# Patient Record
Sex: Female | Born: 1945
Health system: Southern US, Community
[De-identification: ages and names within clinical notes are randomized; demographics above are authoritative.]

---

## 2012-10-01 ENCOUNTER — Ambulatory Visit: Payer: Self-pay | Admitting: Family Medicine

## 2012-10-06 ENCOUNTER — Ambulatory Visit: Payer: Self-pay

## 2012-10-18 ENCOUNTER — Ambulatory Visit: Payer: Self-pay | Admitting: Gynecologic Oncology

## 2012-10-19 ENCOUNTER — Ambulatory Visit: Payer: Self-pay | Admitting: Gynecologic Oncology

## 2012-11-02 ENCOUNTER — Ambulatory Visit: Payer: Self-pay | Admitting: Gynecologic Oncology

## 2012-11-02 DIAGNOSIS — I10 Essential (primary) hypertension: Secondary | ICD-10-CM

## 2012-11-02 LAB — BASIC METABOLIC PANEL
Anion Gap: 4 — ABNORMAL LOW (ref 7–16)
BUN: 18 mg/dL (ref 7–18)
Calcium, Total: 9.7 mg/dL (ref 8.5–10.1)
Creatinine: 0.76 mg/dL (ref 0.60–1.30)
EGFR (African American): >60
Glucose: 89 mg/dL (ref 65–99)
Osmolality: 273 (ref 275–301)
Potassium: 3.4 mmol/L — ABNORMAL LOW (ref 3.5–5.1)
Sodium: 136 mmol/L (ref 136–145)

## 2012-11-02 LAB — CBC
HCT: 38.9 % (ref 35.0–47.0)
HGB: 13.1 g/dL (ref 12.0–16.0)
MCHC: 33.6 g/dL (ref 32.0–36.0)
MCV: 88 fL (ref 80–100)
Platelet: 389 10*3/uL (ref 150–440)
RBC: 4.43 10*6/uL (ref 3.80–5.20)
RDW: 15.4 % — ABNORMAL HIGH (ref 11.5–14.5)
WBC: 10.9 10*3/uL (ref 3.6–11.0)

## 2012-11-13 ENCOUNTER — Ambulatory Visit: Payer: Self-pay | Admitting: Gynecologic Oncology

## 2012-11-23 ENCOUNTER — Inpatient Hospital Stay: Payer: Self-pay | Admitting: Obstetrics and Gynecology

## 2012-11-24 LAB — PATHOLOGY REPORT

## 2012-11-24 LAB — CREATININE, SERUM
EGFR (African American): >60
EGFR (Non-African Amer.): >60

## 2012-11-24 LAB — HEMOGLOBIN: HGB: 11.6 g/dL — ABNORMAL LOW (ref 12.0–16.0)

## 2014-05-05 NOTE — Op Note (Signed)
PATIENT NAME:  Christina Werner, Christina MR#:  [id number] DATE OF BIRTH:  1945/02/14  DATE OF PROCEDURE:  11/23/2012  PREOPERATIVE DIAGNOSIS:  Large ovarian cyst on the right, also right lower quadrant pain.  POSTOPERATIVE DIAGNOSIS: Large ovarian cyst on the right, also right lower quadrant pain.  PROCEDURE:  Total abdominal hysterectomy with right salpingo-oophorectomy.  ANESTHESIA:  General endotracheal.  SURGEON:  Dr. Hildred Laser.  ASSISTANT:  Dr. Wendy Poet.  ESTIMATED BLOOD LOSS:  100 mL.  OPERATIVE FLUIDS:  1400 mL.  COMPLICATIONS:  There were none.  FINDINGS:  Thick fibrotic adhesions of the colon to the left lateral posterior surface of the uterus and pelvic sidewall, as well as from the bladder to the cervix.  Also, there was a large degenerative right ovarian mass approximately 10 to 11 cm.  There was a normal-appearing right fallopian tube.   There was a small normal-appearing uterus and cervix.  The left ovary and fallopian tube were noted to be surgically absent.  Ureters were visualized bilaterally intraoperatively.  SPECIMENS:  Included the uterus, right tube and ovary and cervix.  URINE OUTPUT:  300 mL of clear urine at the end of the procedure.  PROCEDURE:  The patient was taken to the operating room where she was placed in the dorsal lithotomy position, given general anesthesia, prepped and draped in normal sterile fashion.  An infraumbilical vertical midline incision was made and extended sharply to the rectus fascia.  The fascia was then incised and extended vertically using the curved Mayo scissors.  The muscles of the anterior abdominal wall were then separated in the midline by sharp and blunt dissection.  The peritoneum was grasped between 2 pickups, elevated, entered sharply with the Metzenbaum scissors.  The pelvis was examined with the findings noted above.  Next, pelvic washings were performed.  After this, a Bookwalter retractor was placed into the incision and the  bowel was tucked away with moist laparotomy sponges.  The ovarian mass was located on the right side and was exteriorized from the abdomen.  The base of the mass was clamped with a Kelly and transected and sent for frozen section.  Next, 2 Kelly clamps were placed on the cornua and used for retraction. The round ligaments on both sides were identified, clamped, transected and suture ligated with 0 Vicryl.  There were noted to be extensive fibrotic adhesions of the colon to the posterior cul-de-sac.  These adhesions were then taken down using sharp dissection with the Metzenbaum scissors.   After dissection, the posterior leaf of the broad ligament was incised bilaterally and the ureters were identified on both sides, noted to have good efflux.  Next, the anterior leaf of the broad ligament was incised along the bladder reflection to the midline from both sides.  Fibrotic adhesions of the bladder to the cervical aspect of the uterus were also noted and taken down using sharp dissection.  After this, the bladder was then gently dissected off the lower uterine segment and the cervix was sponge sticked.  The infundibulopelvic ligament on the right side was then doubly clamped, transected and suture ligated with 0 Vicryl.   Hemostasis was visualized.  The uterine arteries were skeletonized bilaterally, clamped with Heaney clamps, transected and suture ligated with 0 Vicryl. Again, hemostasis was assured.  The uterosacral ligaments had been clamped on both sides, transected and suture ligated in a similar fashion. The cervix and uterus were amputated with the Mayo scissors. The vaginal cuff angles were then closed with figure-of-eight  stitches of 0 Vicryl.  The remainder of the vaginal cuff was then closed with figure-of-eight suture of 0 Vicryl.  Hemostasis was assured. The pelvis was then irrigated copiously with warm normal saline.  All laparotomy sponges and instruments were removed from the abdomen.  After report of  benign findings noted on frozen section, the fascia was then closed with running 0 looped PDS and hemostasis was assured.  The subcutaneous fat layer was then closed with 2-0 Vicryl in a running fashion. The skin was closed with staples.  Sponge, lap, needle and instrument counts were correct x 2.  The patient was taken to the PACU awake and in stable condition.     ____________________________ Christina EarthlyAnika S. Valentino Saxonherry, MD asc:dmm D: 11/23/2012 16:05:10 ET T: 11/23/2012 19:03:32 ET JOB#: [id number]  cc: Christina EarthlyAnika S. Valentino Saxonherry, MD, <Dictator> Christina NovemberANIKA S Carolie Mcilrath MD ELECTRONICALLY SIGNED 12/06/2012 14:42

## 2015-05-01 IMAGING — US ABDOMEN ULTRASOUND LIMITED
1 series · 14 of 25 positions shown · non-contrast
Comparison: none

REASON FOR EXAM: abd pain acute lower quadrant
COMMENTS:

[Series 1: abdomen ultrasound limited · 0.26mm/px · 14 of 68 slices shown]
[im 1/68]
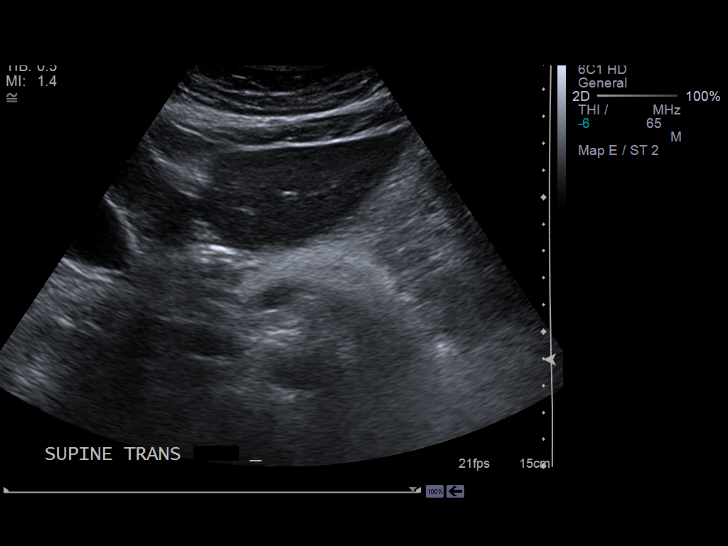
[im 6/68]
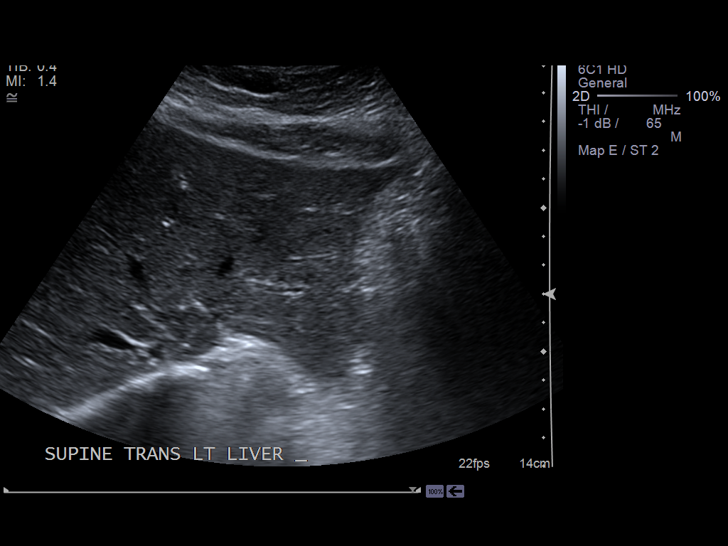
[im 12/68]
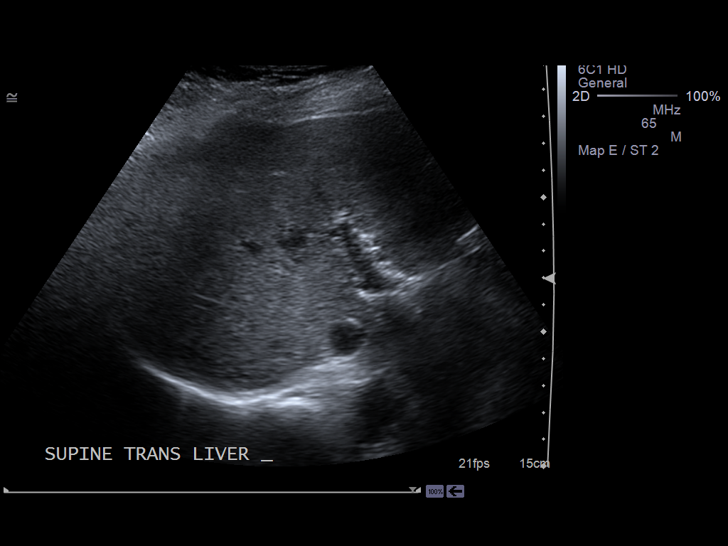
[im 17/68]
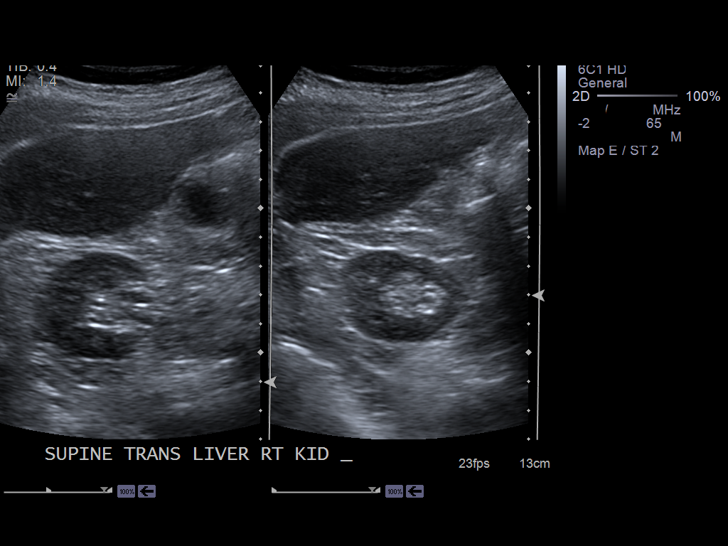
[im 23/68]
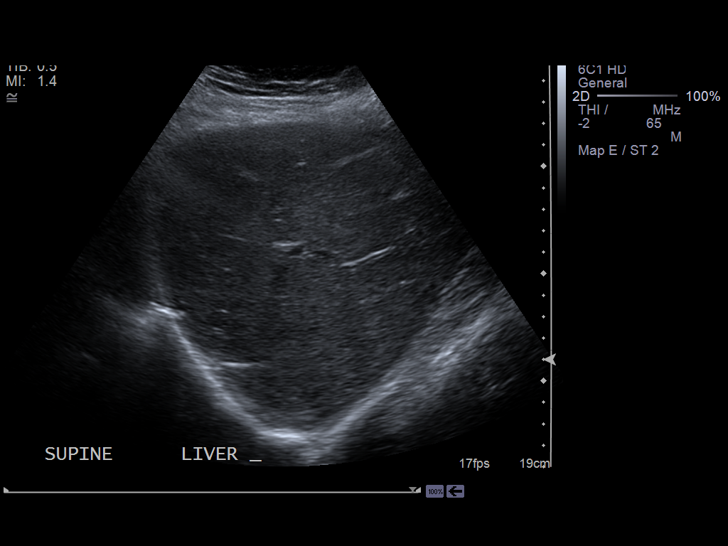
[im 26/68]
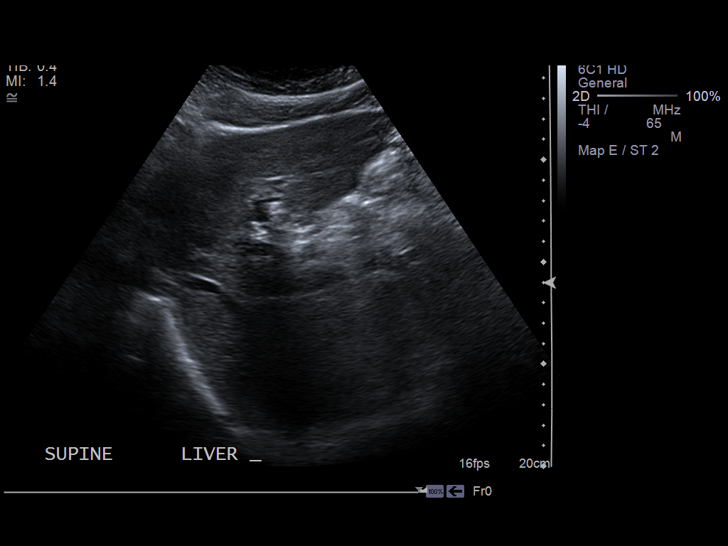
[im 31/68]
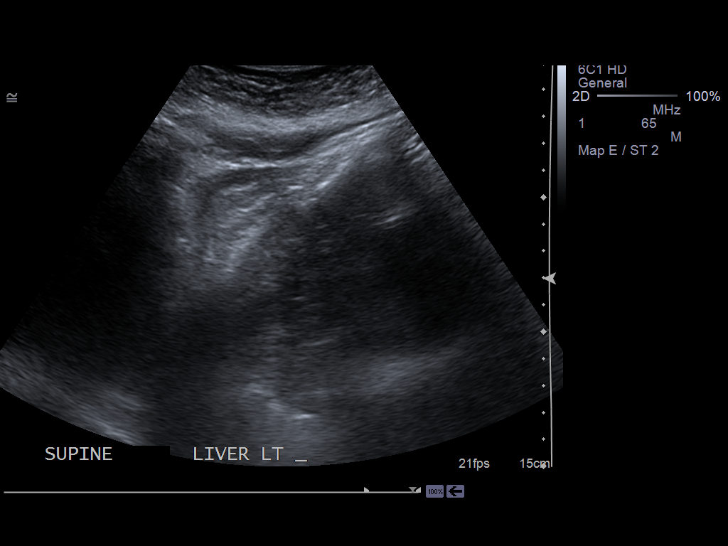
[im 37/68]
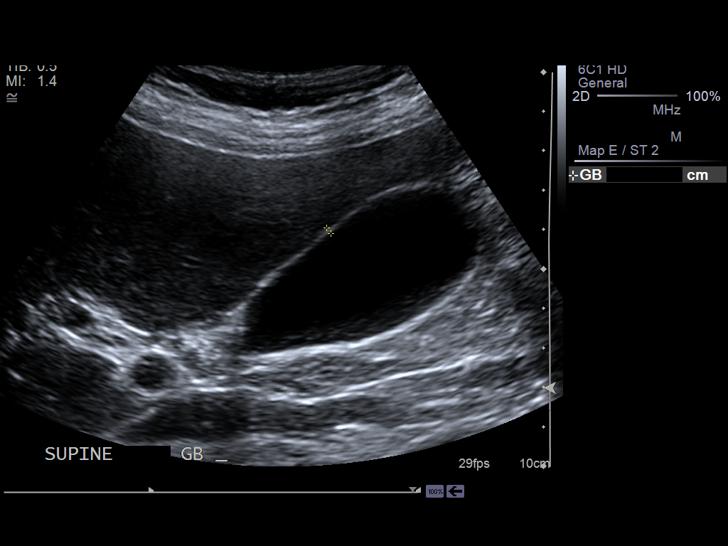
[im 42/68]
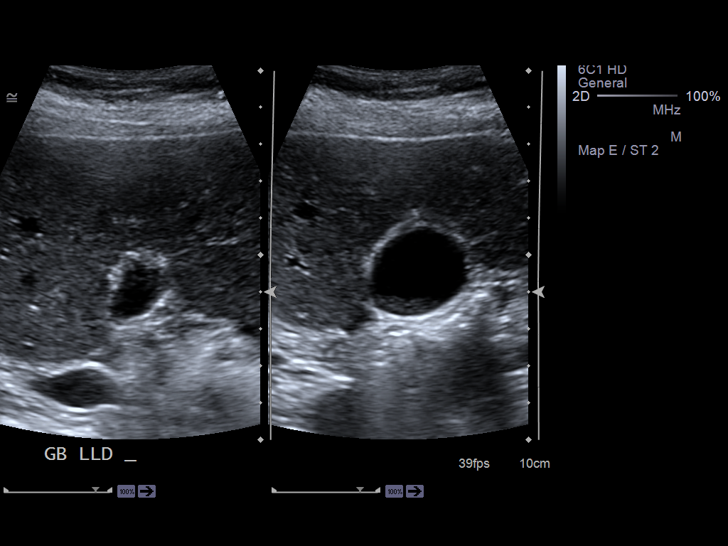
[im 45/68]
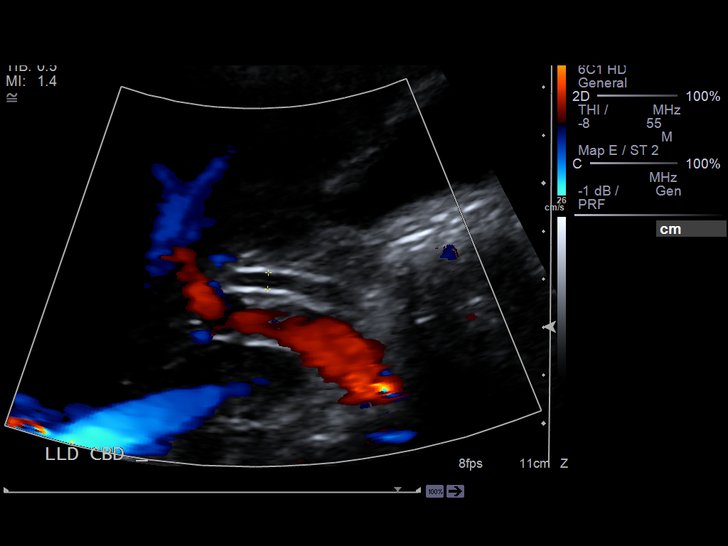
[im 51/68]
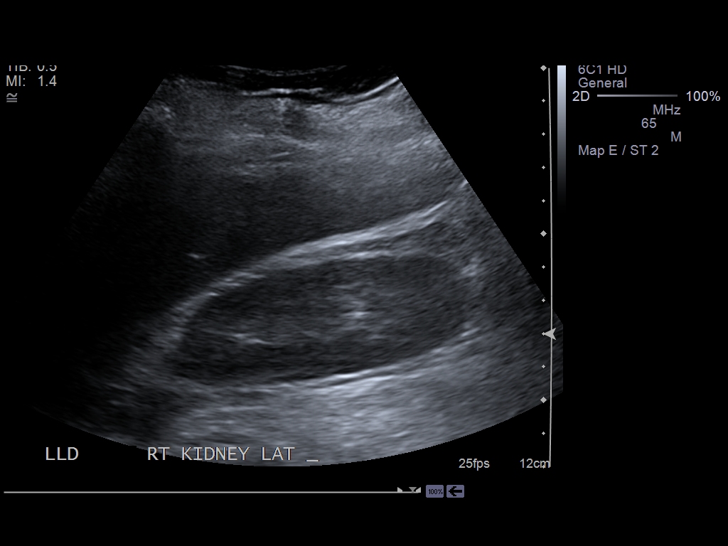
[im 56/68]
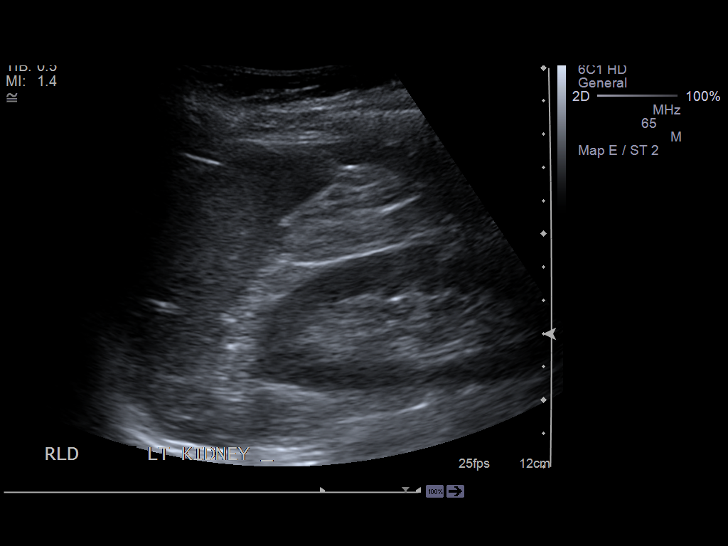
[im 62/68]
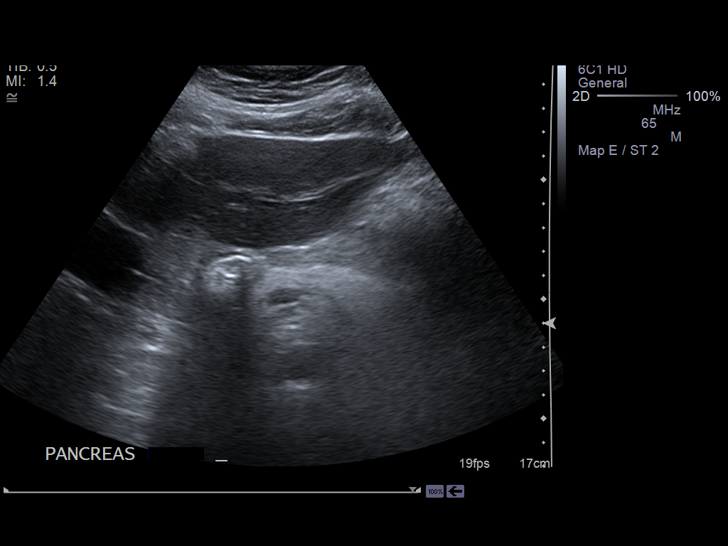
[im 68/68]
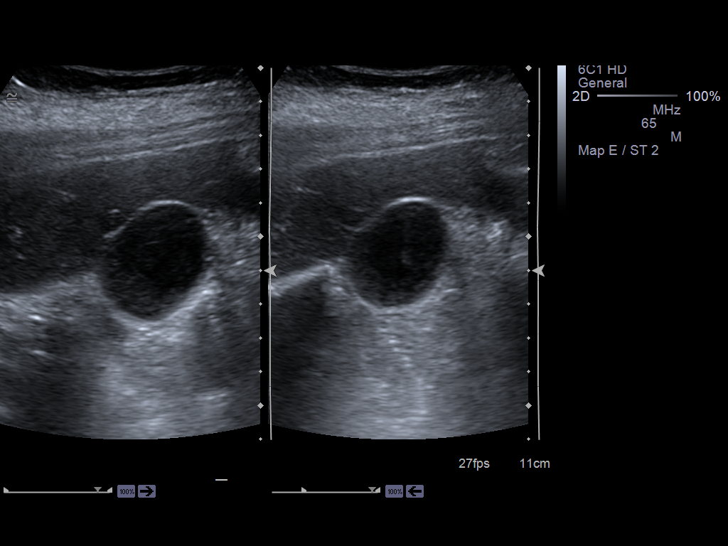

[14 of 25 positions shown; findings below may reference images not displayed]

PROCEDURE:     JANUSZ PIOTR - JANUSZ PIOTR ABDOMEN UPPER GENERAL  - October 01, 2012 [DATE]

RESULT:     Abdominal sonogram is performed. The visualized pancreas appears
to be within normal limits. The liver echotexture is normal. There is a
slightly echogenic sludge in the gallbladder without evidence of stones. The
liver length is 16.09 cm. There is no evidence of ascites in the upper
abdomen. The proximal inferior vena cava is patent. The abdominal aorta is
not visualized well because of overlying bowel gas but there is no definite
aneurysm demonstrated. The gallbladder wall thickness is normal at 1.8 mm.
There is a negative sonographic Murphy's sign. Portal venous flow is normal.
Common bile duct diameter is 4.3 mm maximally. The right kidney measures
10.42 x 3.77 x 4.07 cm. The spleen measures up to 4.84 cm. The left kidney
measures 9.5 x 4.57 x 4.63 cm. Neither kidney shows evidence of a solid or
cystic mass. There is no hydronephrosis or evidence of a stone.
IMPRESSION: Unremarkable abdominal sonogram.

[REDACTED]

## 2015-05-06 IMAGING — CT CT ABD-PELV W/ CM
1 of 2 series · 15 of 32 positions shown, 19 images · non-contrast
Comparison: none

REASON FOR EXAM: Labs 1st  rt adnexal  mass
COMMENTS:

PROCEDURE:     CT  - CT ABDOMEN / PELVIS  W  - October 06, 2012  [DATE]
RESULT:     History: Right adnexal mass.
Comparison Study: Ultrasound abdomen and pelvis 10/01/2012.

[Series 2: soft tissue · axial · 0.68mm/px · z∈[-835,-457]mm · 15 of 138 slices shown, 19 images]
[im 6/138  soft-tissue]
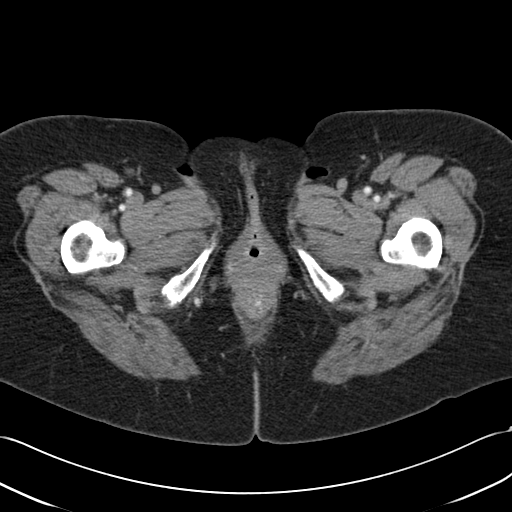
[im 6/138  bone]
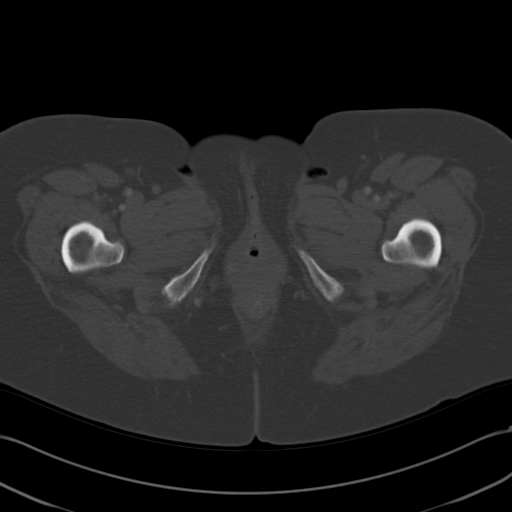
[im 18/138  soft-tissue]
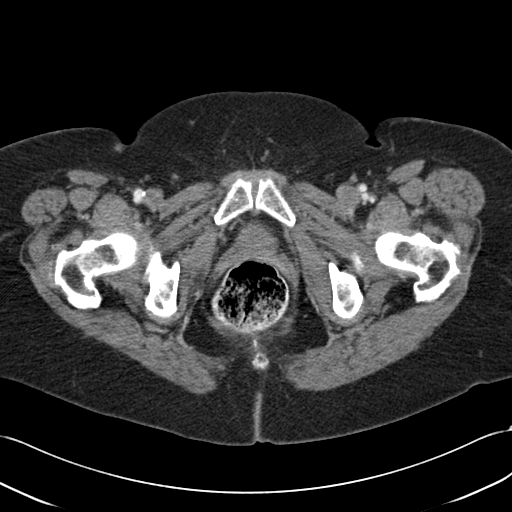
[im 29/138  soft-tissue]
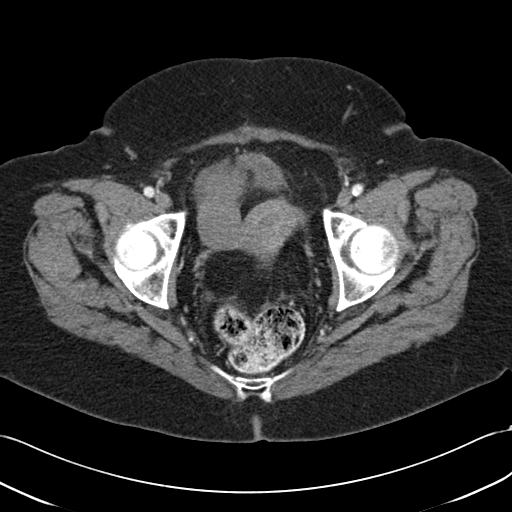
[im 40/138  soft-tissue]
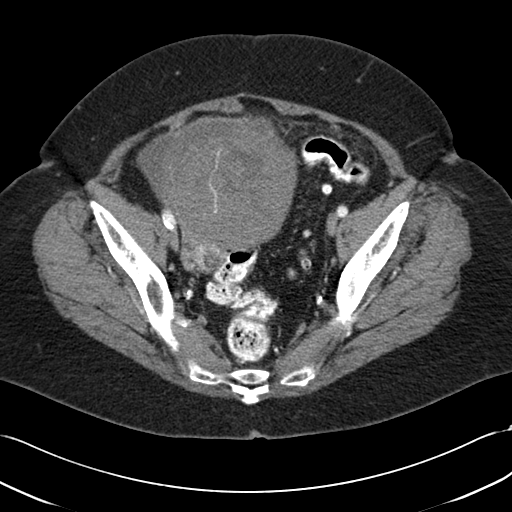
[im 46/138  soft-tissue]
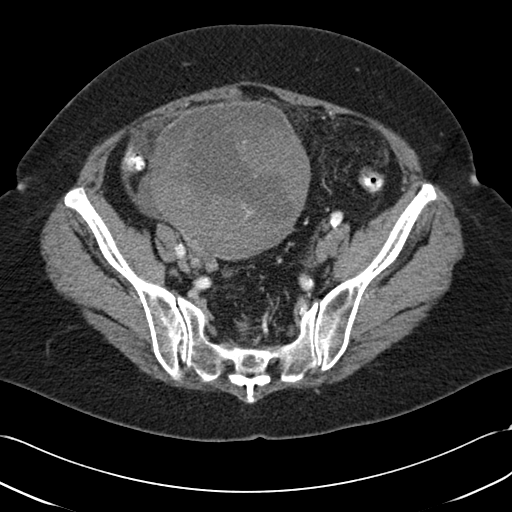
[im 58/138  soft-tissue]
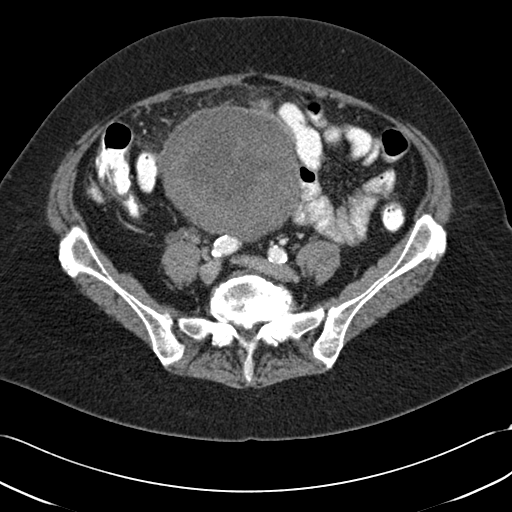
[im 69/138  soft-tissue]
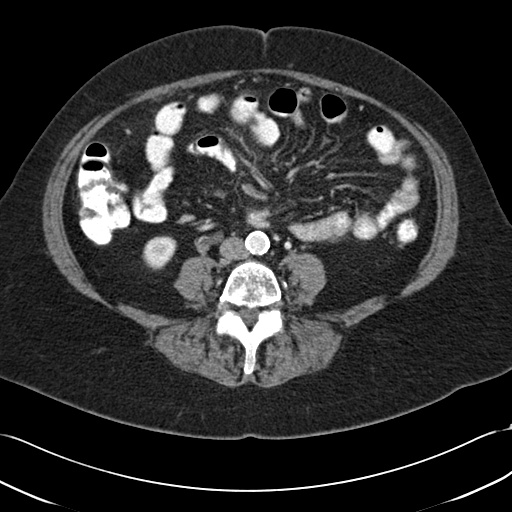
[im 80/138  soft-tissue]
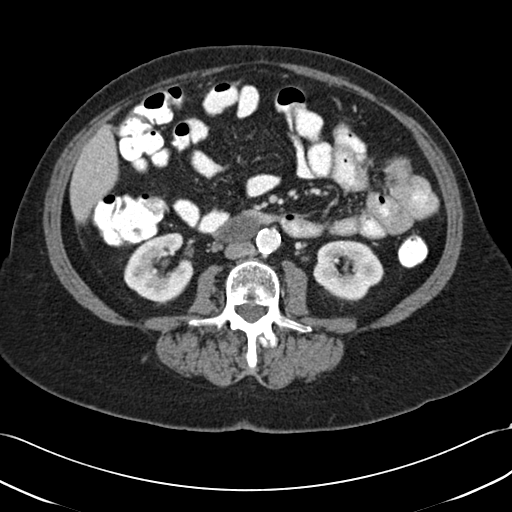
[im 92/138  soft-tissue]
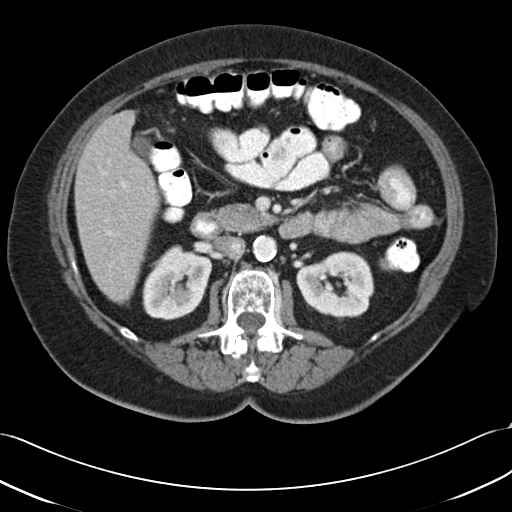
[im 92/138  bone]
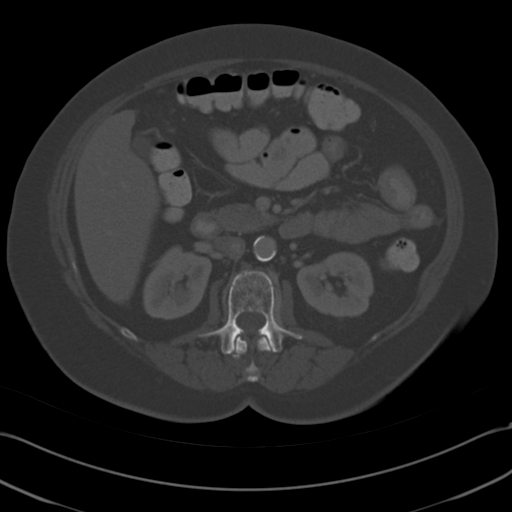
[im 98/138  soft-tissue]
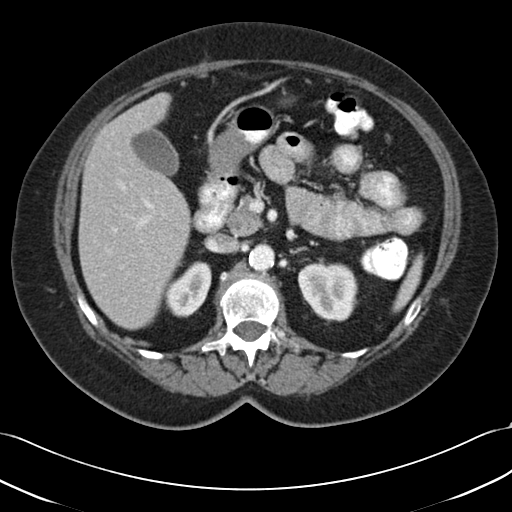
[im 109/138  soft-tissue]
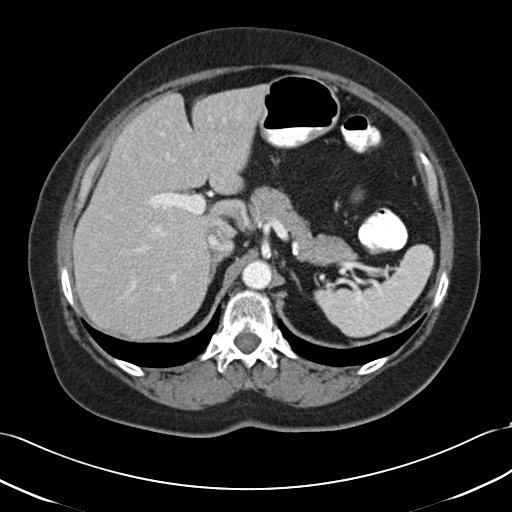
[im 115/138  lung]
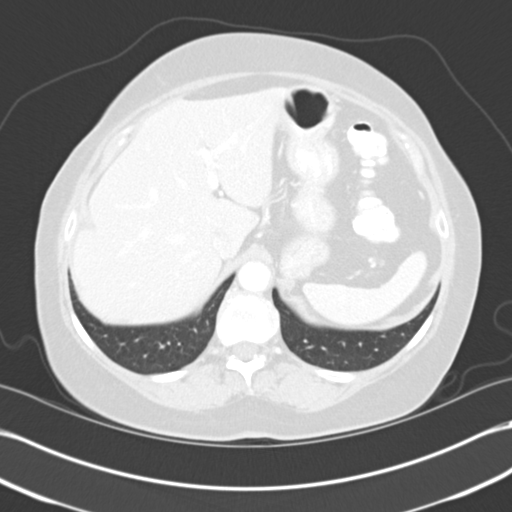
[im 120/138  soft-tissue]
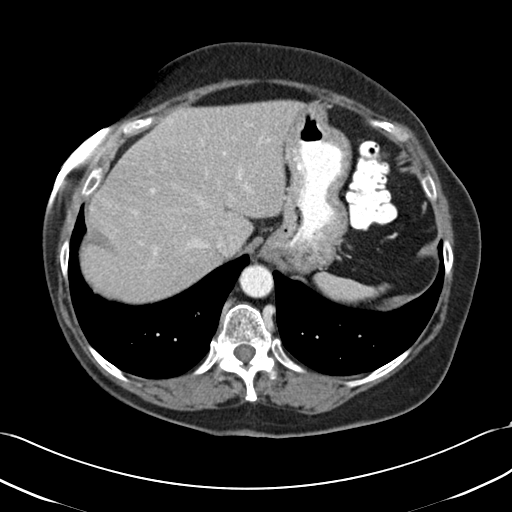
[im 120/138  lung]
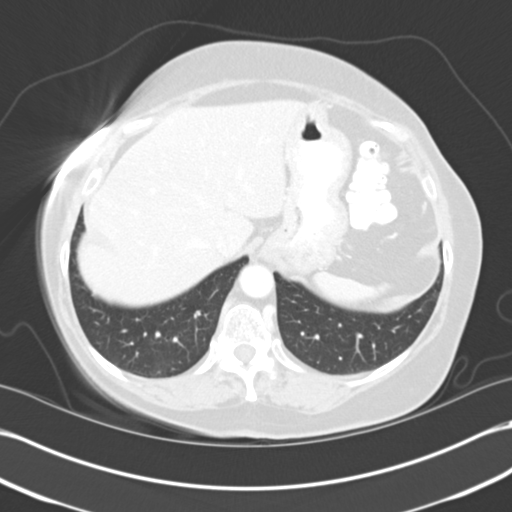
[im 126/138  lung]
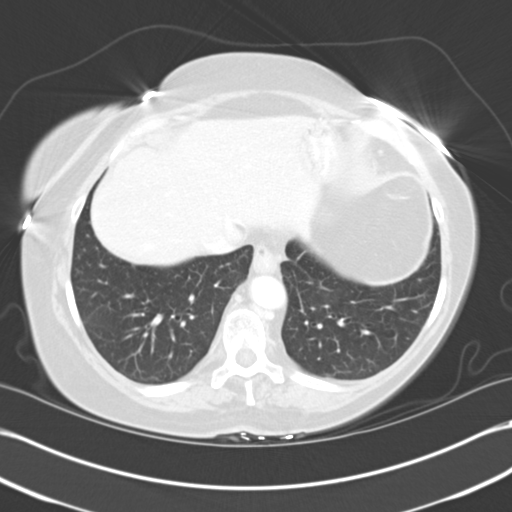
[im 132/138  soft-tissue]
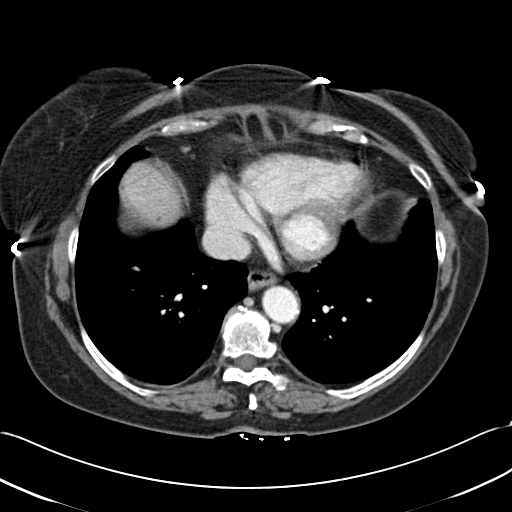
[im 132/138  lung]
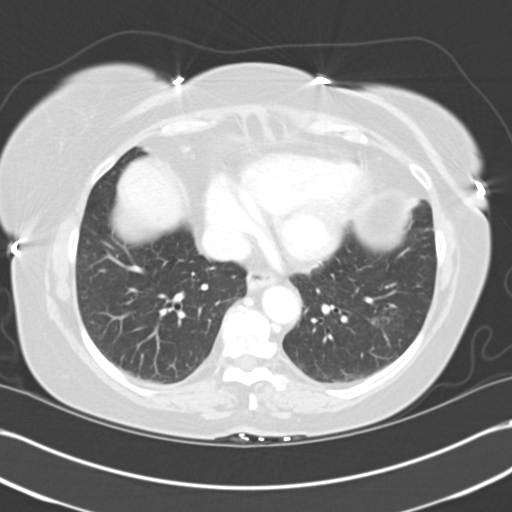

[15 of 32 positions shown; findings below may reference images not displayed]

FINDINGS: Standard CT obtained with 85 cc of [id number]. Evaluation in 3
dimensions on separate workstation performed. Liver normal. Spleen normal.
Gallbladder normal. No biliary distention. Pancreas normal.

1.2 cm left adrenal lesion is noted. This is most likely an adenoma. MRI can
be obtained for further evaluation. Kidneys are normal. No obstructing
ureteral stone or hydronephrosis noted. The bladder is nondistended. Uterus
is unremarkable. A 11 cm x 11 cm heterogeneous mass is present in the right
adnexal region with adjacent small amount of free fluid. This is worrisome
for ovarian tumor.

Aorta patent. Renal and mesenteric vessels are patent. Hepatic veins, portal
vein, splenic vein patent.A 2.5 cm cystic mass is no the retroperitoneum
just anterior to the inferior vena cava. Smaller adjacent cystic lesions are
also present. Although these could represent retroperitoneal lymph
nodes,these could also be benign cysts.

Appendectomy. Diffuse diverticulosis is present. No evidence of
diverticulitis. No bowel distention. No free air. Esophagogastric region
normal.

Lung bases are clear. Heart size normal. No bony abnormality.
IMPRESSION: 1. 11 X 11 cm heterogeneous right adnexal mass concerning for primary
ovarian tumor. Small amount of free peritoneal fluid is present. 2.1.2
centimeter left adrenal lesion most likely adrenal adenoma. MRI of the
adrenals can be obtained for further evaluation.
3. 2.5 cm cystic lesion noted in the retroperitoneum with adjacent smaller
cysts. Although these may represent benign cysts, lymph nodes cannot be
excluded.

A PET CT may prove useful further evaluation. Gynecologic consultation
should be considered.
